# Patient Record
Sex: Female | Born: 1980 | Race: White | Hispanic: No | Marital: Married | Smoking: Former smoker
Health system: Southern US, Community
[De-identification: ages and names within clinical notes are randomized; demographics above are authoritative.]

## PROBLEM LIST (undated history)

## (undated) HISTORY — PX: WRIST SURGERY: SHX841

## (undated) HISTORY — PX: TONSILLECTOMY: SUR1361

---

## 2017-04-20 ENCOUNTER — Ambulatory Visit: Payer: Self-pay | Admitting: Osteopathic Medicine

## 2017-04-23 ENCOUNTER — Ambulatory Visit: Payer: Self-pay | Admitting: Osteopathic Medicine

## 2019-05-12 ENCOUNTER — Ambulatory Visit: Payer: Self-pay | Attending: Internal Medicine

## 2019-05-12 DIAGNOSIS — Z23 Encounter for immunization: Secondary | ICD-10-CM | POA: Insufficient documentation

## 2019-05-12 NOTE — Progress Notes (Signed)
   Covid-19 Vaccination Clinic  Name:  Felicia Campbell    MRN: [id number] DOB: 10/03/1980  05/12/2019  [name] was observed post Covid-19 immunization for 15 minutes without incident. She was provided with Vaccine Information Sheet and instruction to access the V-Safe system.   [name] was instructed to call 911 with any severe reactions post vaccine: Marland Kitchen Difficulty breathing  . Swelling of face and throat  . A fast heartbeat  . A bad rash all over body  . Dizziness and weakness

## 2019-06-14 ENCOUNTER — Ambulatory Visit: Payer: Self-pay

## 2019-06-14 ENCOUNTER — Ambulatory Visit: Payer: Self-pay | Attending: Internal Medicine

## 2019-06-14 DIAGNOSIS — Z23 Encounter for immunization: Secondary | ICD-10-CM

## 2019-06-14 NOTE — Progress Notes (Signed)
   Covid-19 Vaccination Clinic  Name:  [name]    MRN: [id number] DOB: [birth year]  06/14/2019  [name] was observed post Covid-19 immunization for 15 minutes without incident. She was provided with Vaccine Information Sheet and instruction to access the V-Safe system.   [name] was instructed to call 911 with any severe reactions post vaccine: Marland Kitchen Difficulty breathing  . Swelling of face and throat  . A fast heartbeat  . A bad rash all over body  . Dizziness and weakness   Immunizations Administered    Name Date Dose VIS Date Route   Pfizer COVID-19 Vaccine 06/14/2019  2:58 PM 0.3 mL 02/17/2019 Intramuscular   Manufacturer: [employer], [employer]   Lot: [id number]   NDC: [address]-7

## 2019-08-29 ENCOUNTER — Emergency Department (INDEPENDENT_AMBULATORY_CARE_PROVIDER_SITE_OTHER)
Admission: EM | Admit: 2019-08-29 | Discharge: 2019-08-29 | Disposition: A | Discharge status: Home / Self Care | Payer: BC Managed Care – PPO | Source: Home / Self Care | Attending: Family Medicine | Admitting: Family Medicine

## 2019-08-29 ENCOUNTER — Emergency Department (INDEPENDENT_AMBULATORY_CARE_PROVIDER_SITE_OTHER): Payer: BC Managed Care – PPO

## 2019-08-29 ENCOUNTER — Encounter: Payer: Self-pay | Admitting: Emergency Medicine

## 2019-08-29 ENCOUNTER — Other Ambulatory Visit: Payer: Self-pay

## 2019-08-29 DIAGNOSIS — R1904 Left lower quadrant abdominal swelling, mass and lump: Secondary | ICD-10-CM

## 2019-08-29 DIAGNOSIS — R1032 Left lower quadrant pain: Secondary | ICD-10-CM

## 2019-08-29 LAB — POCT URINALYSIS DIP (MANUAL ENTRY)
Bilirubin, UA: NEGATIVE
Blood, UA: NEGATIVE
Glucose, UA: NEGATIVE mg/dL
Ketones, POC UA: NEGATIVE mg/dL
Leukocytes, UA: NEGATIVE
Nitrite, UA: NEGATIVE
Protein Ur, POC: NEGATIVE mg/dL
Spec Grav, UA: 1.02 (ref 1.010–1.025)
Urobilinogen, UA: 0.2 E.U./dL
pH, UA: 5.5 (ref 5.0–8.0)

## 2019-08-29 MED ORDER — IBUPROFEN 800 MG PO TABS
800.0000 mg | ORAL_TABLET | Freq: Once | ORAL | Status: AC
  Administered 2019-08-29: 800 mg via ORAL

## 2019-08-29 NOTE — ED Provider Notes (Signed)
[facility]    CSN: [id number] Arrival date & time: 08/29/19  1159      History   Chief Complaint Chief Complaint  Patient presents with  . Abdominal Pain    HPI Felicia Campbell is a 39 y.o. female.   At about noon yesterday patient developed a vague burning pressure-like pain in her left lower abdomen at the site of her C-section scar.  The pain has intensified today, worse with any movement.  She denies pelvic pain, urinary symptoms, vaginal discharge, nausea/vomiting, and fevers, chills, and sweats.  She denies recent injury, and no recent increase in physical activity. No LMP recorded. (Menstrual status: IUD).        The history is provided by the patient.  Abdominal Pain Pain location:  LLQ Pain quality: burning   Pain radiates to:  Does not radiate Pain severity:  Mild Onset quality:  Sudden Duration:  1 day Timing:  Constant Progression:  Worsening Chronicity:  New Context: previous surgery   Context: not awakening from sleep, not diet changes, not eating, not recent illness and not trauma   Relieved by:  Nothing Worsened by:  Movement, palpation and position changes Ineffective treatments:  None tried Associated symptoms: no anorexia, no belching, no chest pain, no chills, no constipation, no cough, no diarrhea, no dysuria, no fatigue, no fever, no flatus, no hematemesis, no hematochezia, no hematuria, no melena, no nausea, no vaginal bleeding, no vaginal discharge and no vomiting   Risk factors: multiple surgeries and obesity     History reviewed. No pertinent past medical history.  There are no problems to display for this patient.   Past Surgical History:  Procedure Laterality Date  . CESAREAN SECTION     twice  . TONSILLECTOMY    . WRIST SURGERY      OB History    [id number]            Home Medications    Prior to Admission medications   Not on File    Family History No family history on file.  Social History Social  History   Tobacco Use  . Smoking status: Former Games developer  . Smokeless tobacco: Never Used  Substance Use Topics  . Alcohol use: Yes  . Drug use: Not on file     Allergies   Tetracyclines & related   Review of Systems Review of Systems  Constitutional: Negative for chills, diaphoresis, fatigue and fever.  Respiratory: Negative for cough.   Cardiovascular: Negative for chest pain.  Gastrointestinal: Positive for abdominal pain. Negative for abdominal distention, anorexia, blood in stool, constipation, diarrhea, flatus, hematemesis, hematochezia, melena, nausea and vomiting.  Genitourinary: Negative for dysuria, flank pain, frequency, hematuria, pelvic pain, urgency, vaginal bleeding and vaginal discharge.  Skin: Negative for rash.  All other systems reviewed and are negative.    Physical Exam Triage Vital Signs ED Triage Vitals  Enc Vitals Group     BP 08/29/19 1227 117/84     Pulse Rate 08/29/19 1227 65     Resp 08/29/19 1227 16     Temp 08/29/19 1227 97.9 F (36.6 C)     Temp Source 08/29/19 1227 Oral     SpO2 08/29/19 1227 97 %     Weight 08/29/19 1228 235 lb (106.6 kg)     Height 08/29/19 1228 5\' 9"  (1.753 m)     Head Circumference --      Peak Flow --      Pain Score 08/29/19 1228 6  Pain Loc --      Pain Edu? --      Excl. in GC? --    No data found.  Updated Vital Signs BP 117/84 (BP Location: Right Arm)   Pulse 65   Temp 97.9 F (36.6 C) (Oral)   Resp 16   Ht 5\' 9"  (1.753 m)   Wt 106.6 kg   SpO2 97%   BMI 34.70 kg/m   Visual Acuity Right Eye Distance:   Left Eye Distance:   Bilateral Distance:    Right Eye Near:   Left Eye Near:    Bilateral Near:     Physical Exam Vitals and nursing note reviewed.  Constitutional:      General: She is not in acute distress. HENT:     Head: Normocephalic.     Nose: Nose normal.     Mouth/Throat:     Pharynx: Oropharynx is clear.  Eyes:     Conjunctiva/sclera: Conjunctivae normal.     Pupils:  Pupils are equal, round, and reactive to light.  Cardiovascular:     Rate and Rhythm: Normal rate.     Heart sounds: Normal heart sounds.  Pulmonary:     Effort: Pulmonary effort is normal.     Breath sounds: Normal breath sounds.  Abdominal:     Palpations: Abdomen is soft.     Tenderness: There is abdominal tenderness.       Comments: There is distinct tenderness to palpation at left aspect of C-section surgical scar. No definite hernia palpated during Valsalva  Musculoskeletal:     Right lower leg: No edema.     Left lower leg: No edema.  Lymphadenopathy:     Cervical: No cervical adenopathy.  Skin:    General: Skin is warm and dry.     Findings: No rash.  Neurological:     Mental Status: She is alert and oriented to person, place, and time.      UC Treatments / Results  Labs (all labs ordered are listed, but only abnormal results are displayed) Labs Reviewed  POCT URINALYSIS DIP (MANUAL ENTRY) - Abnormal; Notable for the following components:      Result Value   Color, UA light yellow (*)    All other components within normal limits    EKG   Radiology PELVIS LIMITED (TRANSABDOMINAL ONLY)  Result Date: 08/29/2019 CLINICAL DATA:  Left lower quadrant pain. EXAM: LIMITED ULTRASOUND OF PELVIS TECHNIQUE: Limited transabdominal ultrasound examination of the pelvis was performed. COMPARISON:  Abdomen series 08/29/2019. FINDINGS: Ill-defined 2.2 x 1.8 x 2.2 cm heterogeneous subcutaneous lesion. Vascular flow noted within the lesion by Doppler exam. The lesion may be connected to the skin surface. This is in the site of prior Caesarean section could represent scarring. Infection or malignancy cannot be excluded. No cystic abnormalities identified. IMPRESSION: Ill-defined 2.2 x 1.8 x 2.2 cm heterogeneous subcutaneous lesion in the region the patient's series scar as above. Electronically Signed   By: 08/31/2019  Register   On: 08/29/2019 15:22   DG Abd 2 Views  Result Date:  08/29/2019 CLINICAL DATA:  LEFT lower quadrant pain since yesterday EXAM: ABDOMEN - 2 VIEW COMPARISON:  None FINDINGS: Lung bases clear. Scattered gas and stool throughout colon to rectum. No bowel dilatation, bowel wall thickening, or free air. IUD projects over pelvis. Small calcification projects over sacrum, question phlebolith versus uterine. No definite urinary tract calcifications. Osseous structures unremarkable. IMPRESSION: No acute abnormalities. Electronically Signed   By: 08/31/2019  M.D.   On: 08/29/2019 13:41    Procedures Procedures (including critical care time)  Medications Ordered in UC Medications  ibuprofen (ADVIL) tablet 800 mg (800 mg Oral Given 08/29/19 1614)    Initial Impression / Assessment and Plan / UC Course  I have reviewed the triage vital signs and the nursing notes.  Pertinent labs & imaging results that were available during my care of the patient were reviewed by me and considered in my medical decision making (see chart for details).    Will refer to surgeon Dr. Tally Due for evaluation and management.   Final Clinical Impressions(s) / UC Diagnoses   Final diagnoses:  Left lower quadrant abdominal pain  Abdominal wall mass of left lower quadrant     Discharge Instructions     May take Ibuprofen 200mg , 4 tabs every 8 hours with food as needed for pain.    ED Prescriptions    None        Kandra Nicolas, MD 08/29/19 1642

## 2019-08-29 NOTE — Discharge Instructions (Addendum)
May take Ibuprofen 200mg, 4 tabs every 8 hours with food as needed for pain. 

## 2019-08-29 NOTE — ED Triage Notes (Signed)
Patient having lower left quadrant pain that is constant with occasional burning since yesterday; was able to sleep last night; denies nausea, vomiting, diarrhea, dysuria. No OTC today. Has had covid vaccinations.

## 2021-11-17 IMAGING — DX DG ABDOMEN 2V
3 series · 3 of 3 positions shown · non-contrast
Comparison: None

CLINICAL DATA: LEFT lower quadrant pain since yesterday

EXAM:
ABDOMEN - 2 VIEW

[abdomen erect]
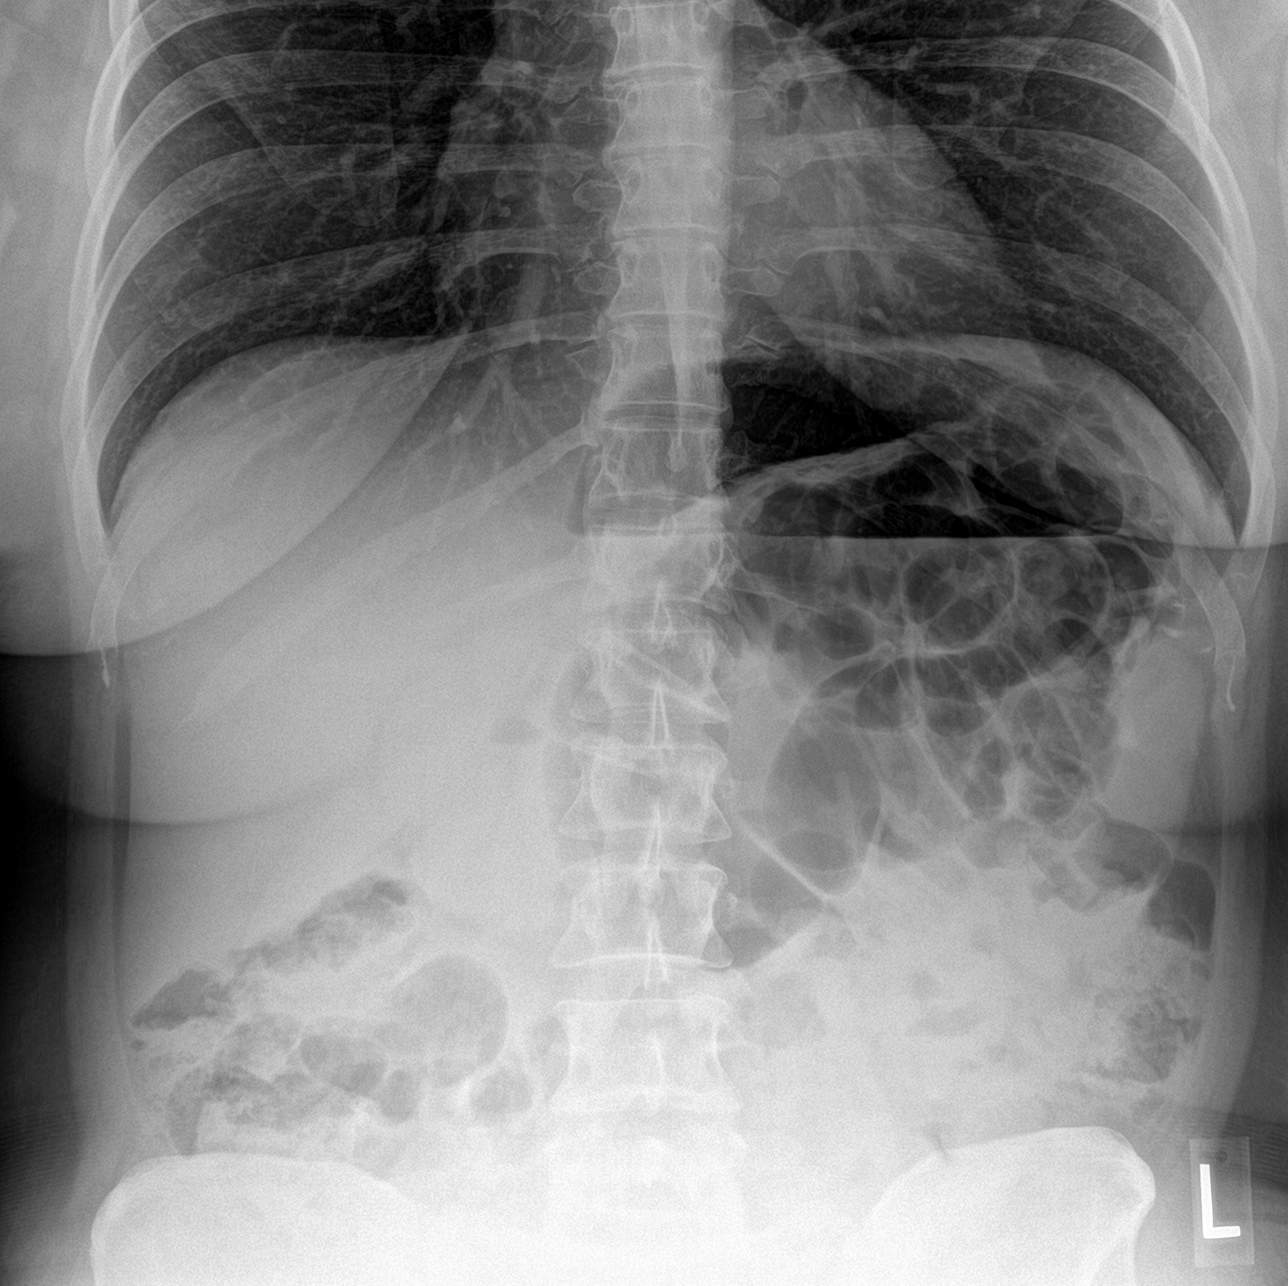

[abdomen supine (1 of 2)]
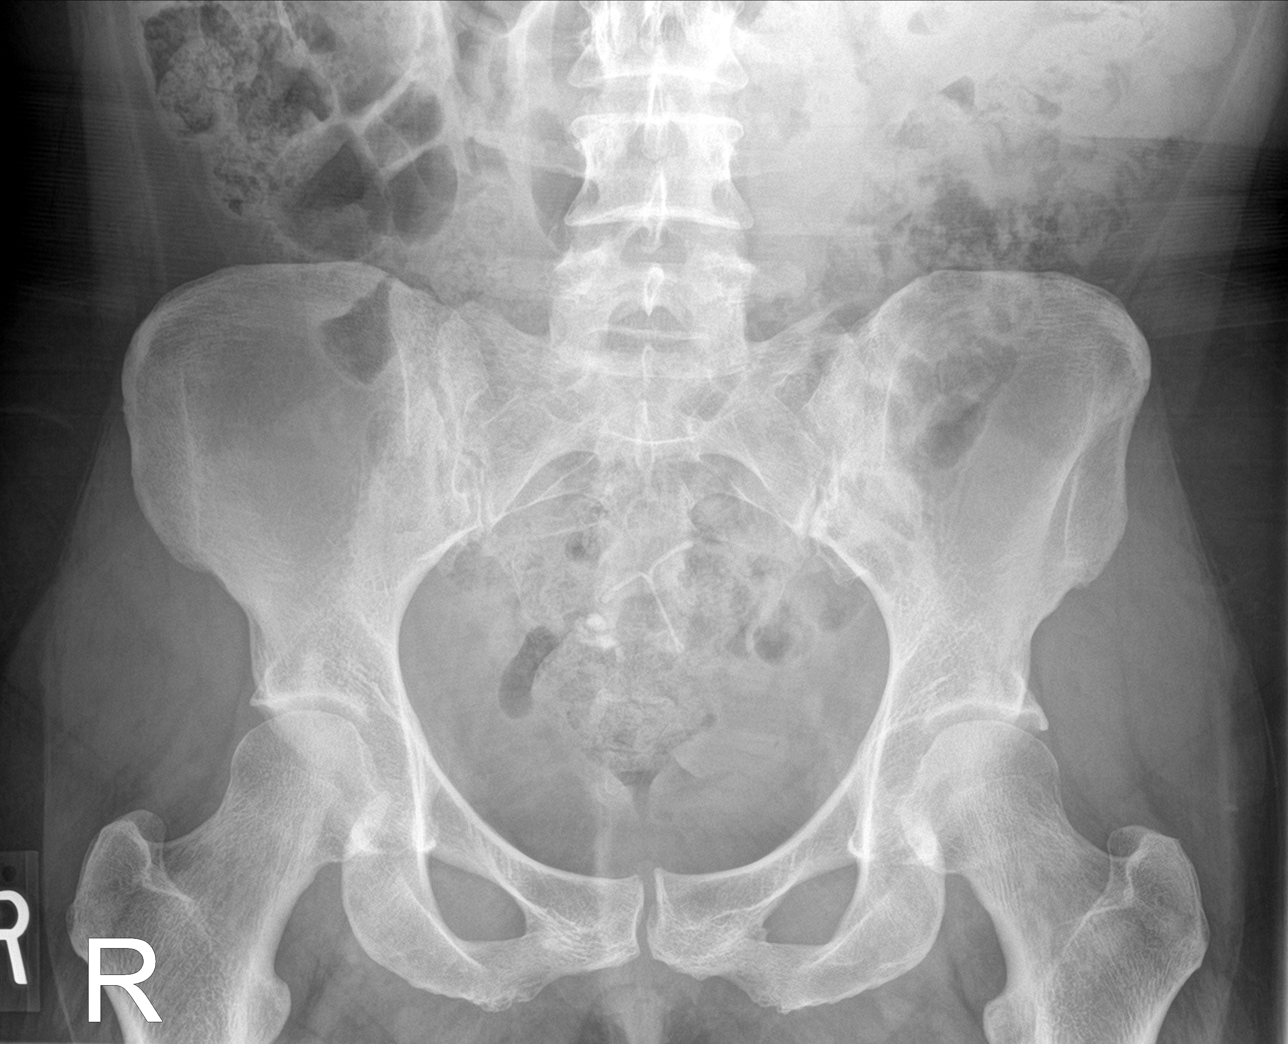

[abdomen supine (2 of 2)]
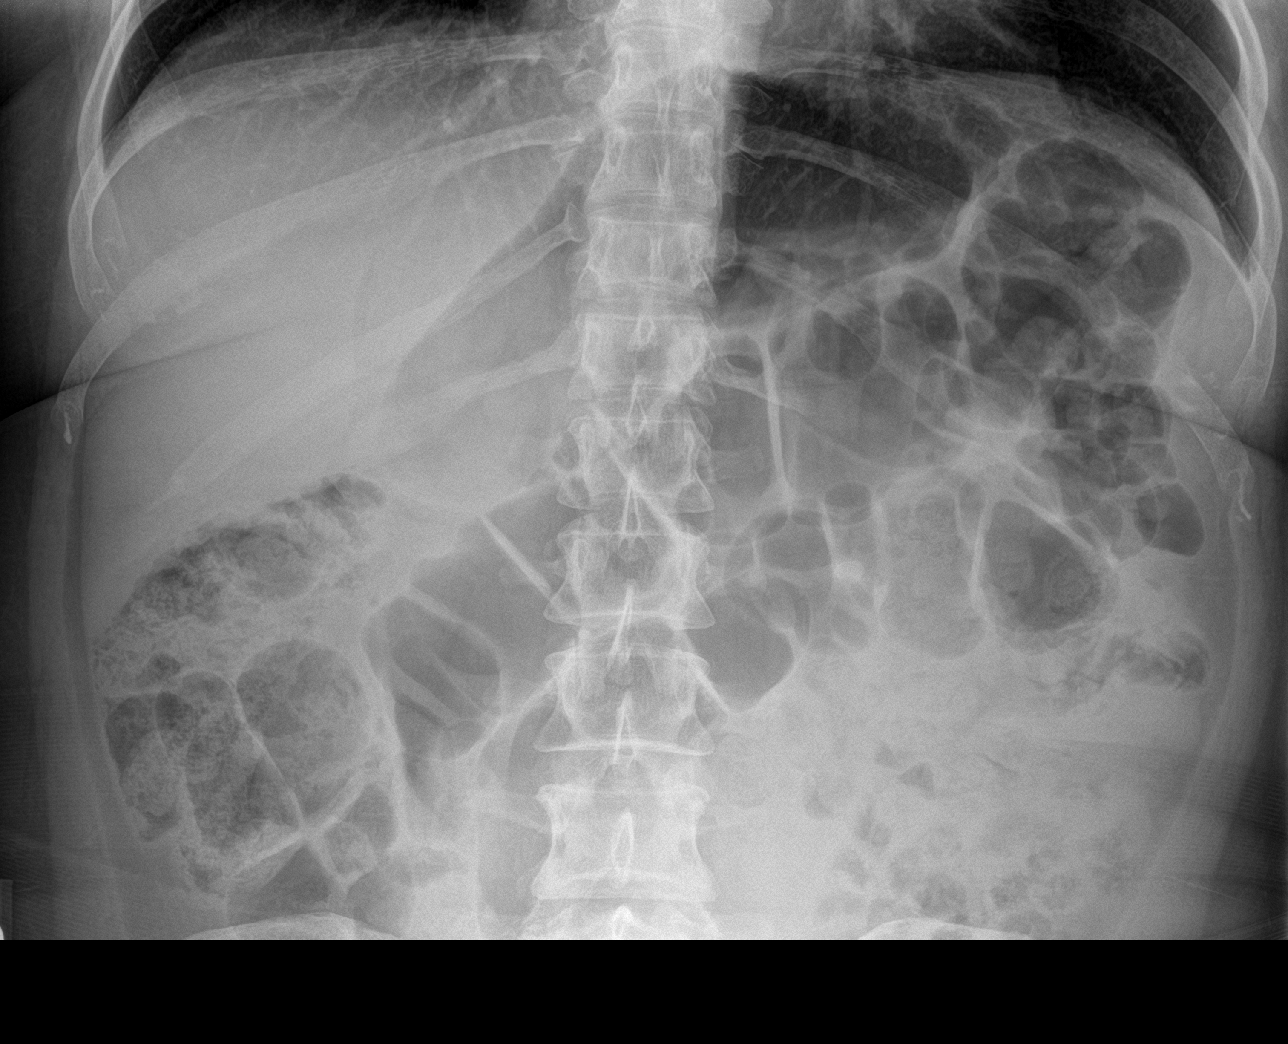

[3 of 3 positions shown; findings below may reference images not displayed]

FINDINGS: Lung bases clear.

Scattered gas and stool throughout colon to rectum.

No bowel dilatation, bowel wall thickening, or free air.

IUD projects over pelvis.

Small calcification projects over sacrum, question phlebolith versus
uterine.

No definite urinary tract calcifications.

Osseous structures unremarkable.
IMPRESSION: No acute abnormalities.

## 2021-11-17 IMAGING — US US PELVIS LIMITED
1 series · 14 of 25 positions shown · non-contrast
Comparison: Abdomen series 08/29/2019.

CLINICAL DATA: Left lower quadrant pain.

EXAM:
LIMITED ULTRASOUND OF PELVIS
TECHNIQUE: Limited transabdominal ultrasound examination of the pelvis was
performed.

[Series 1: us pelvis limited · 30 acquisitions, 14 frames shown]
[im 1/30]
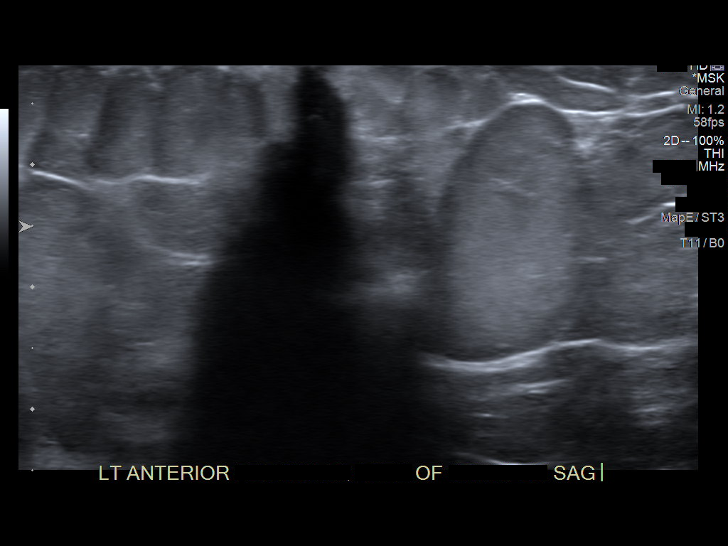
[im 3/30]
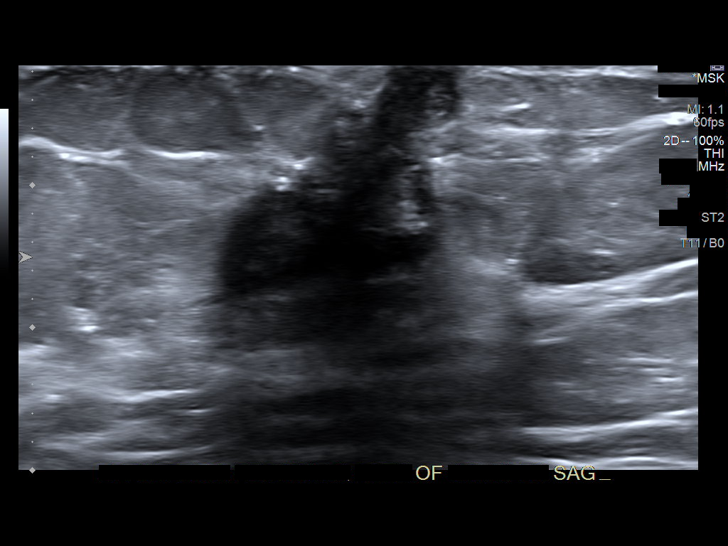
[im 5/30]
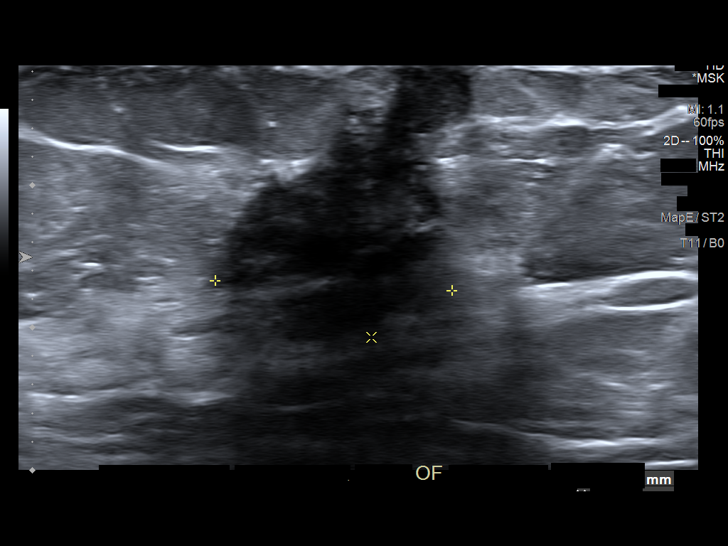
[im 8/30]
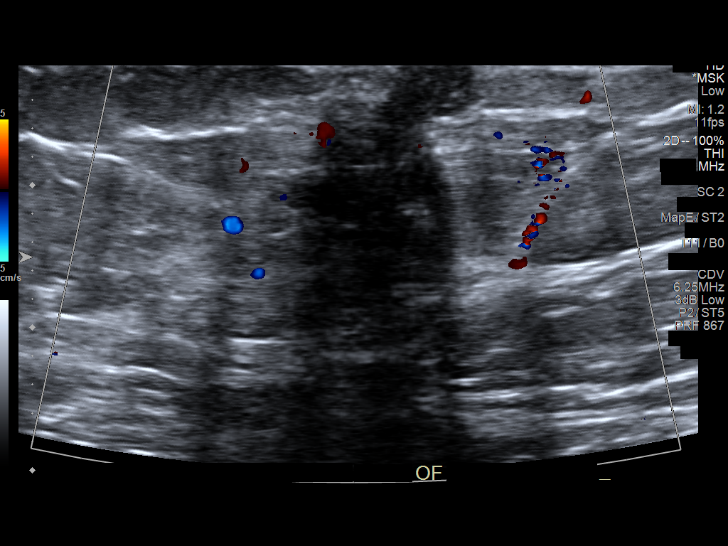
[im 10/30]
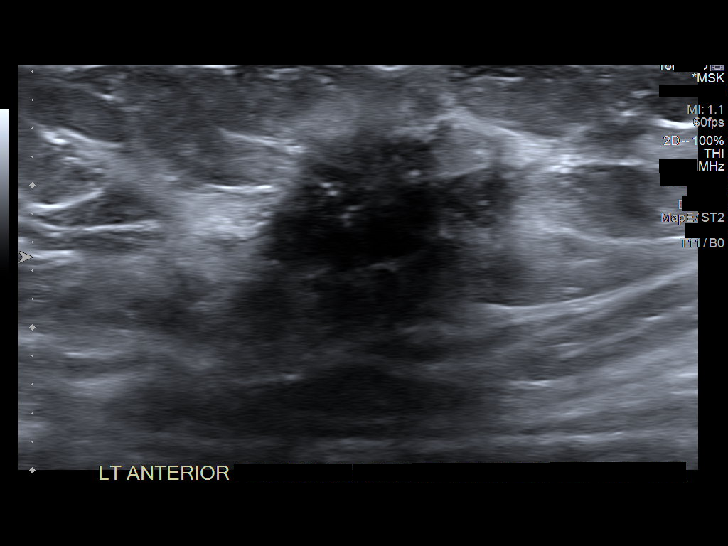
[im 11/30]
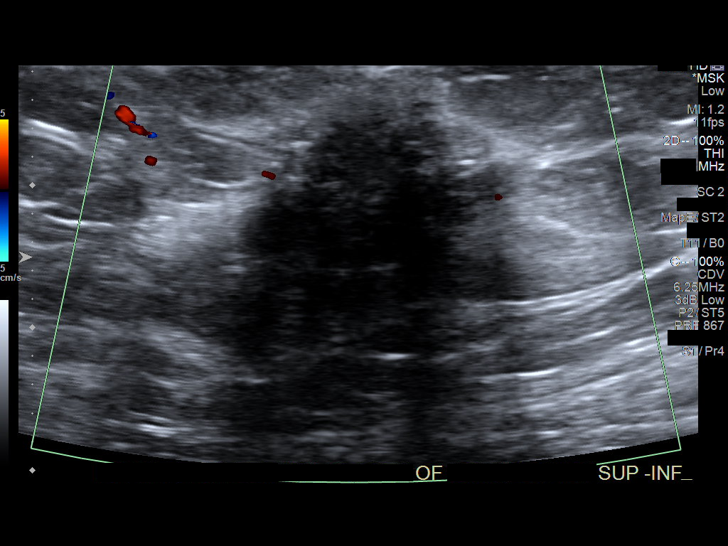
[im 14/30]
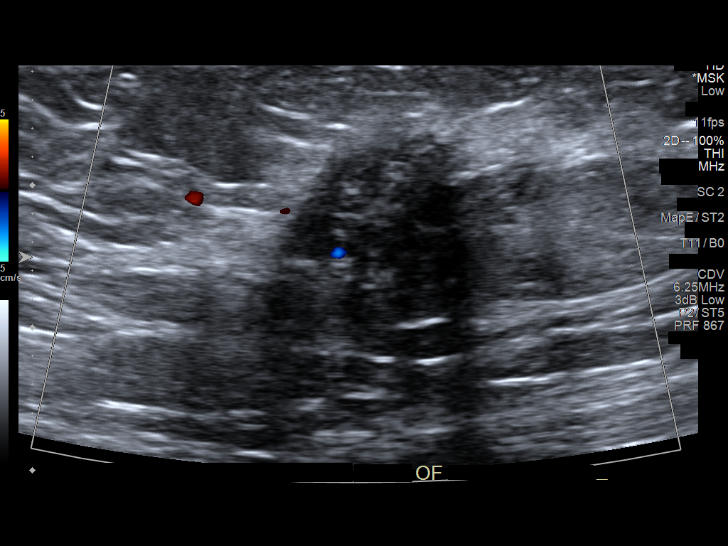
[im 16/30]
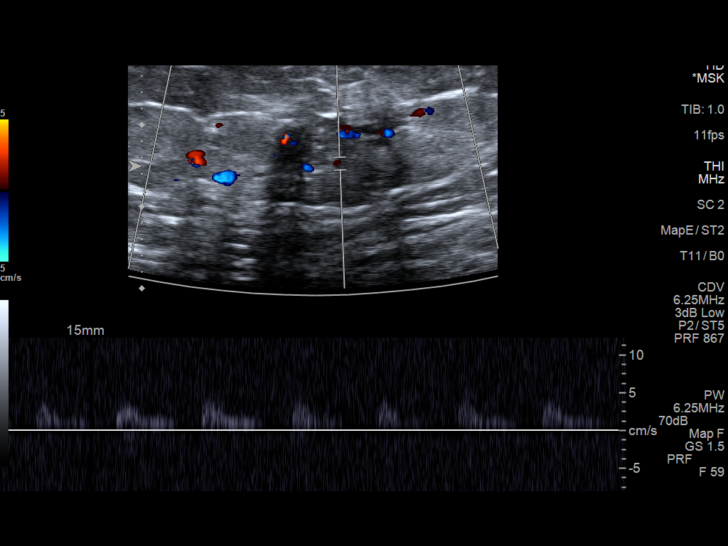
[im 19/30]
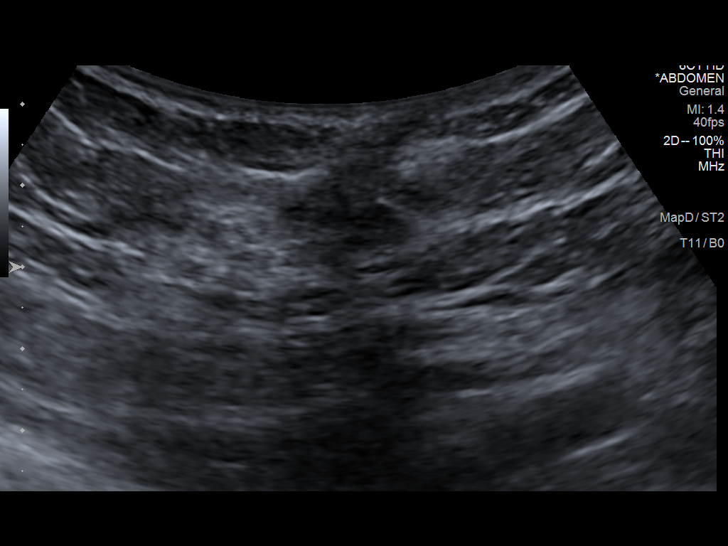
[im 20/30]
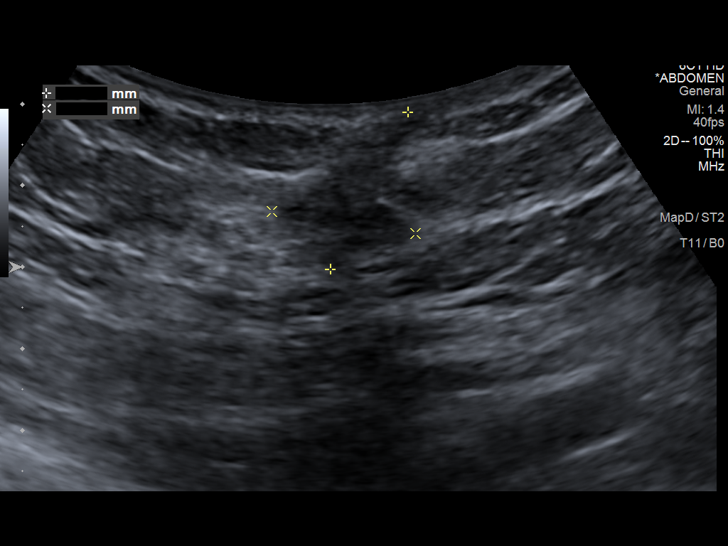
[im 22/30]
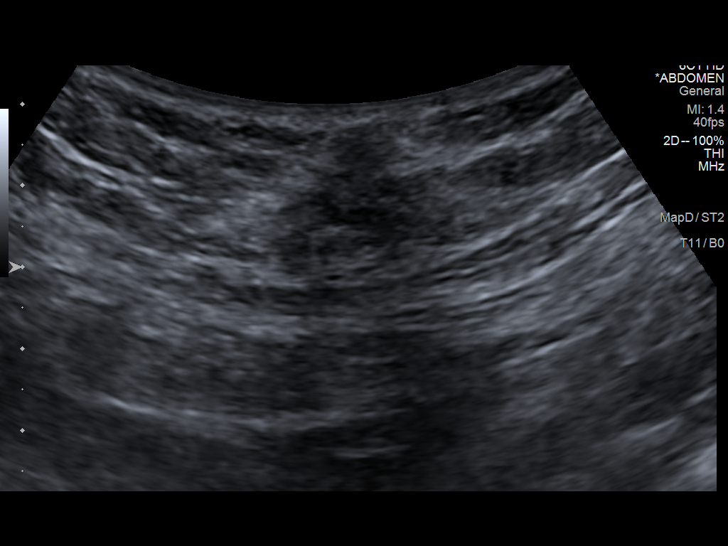
[im 25/30]
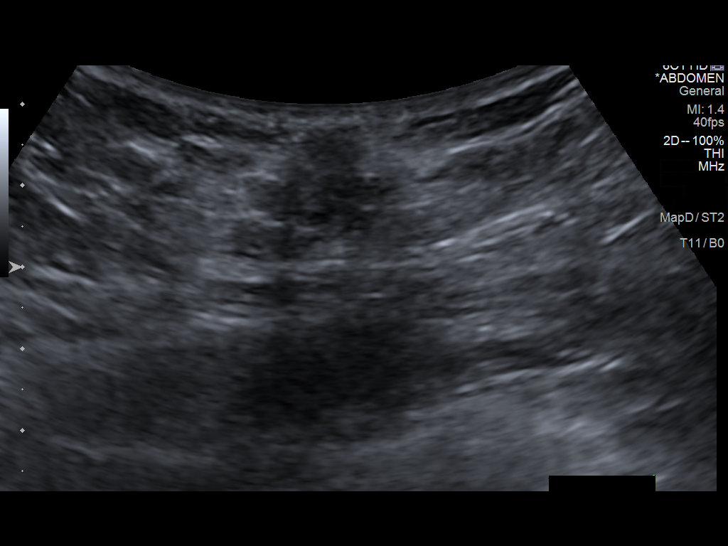
[im 27/30]
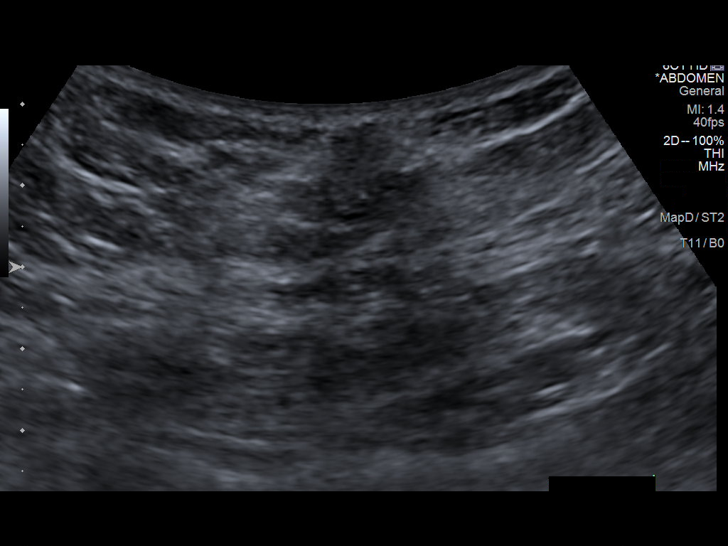
[im 30/30]
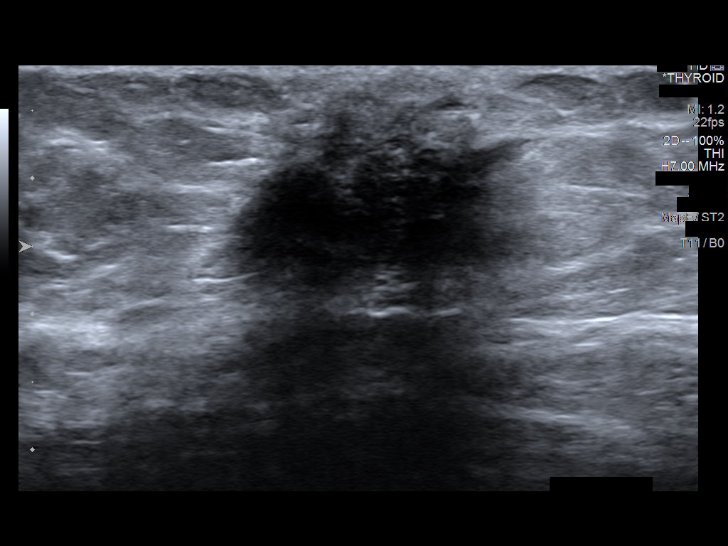

[14 of 25 positions shown; findings below may reference images not displayed]

FINDINGS: Ill-defined 2.2 x 1.8 x 2.2 cm heterogeneous subcutaneous lesion.
Vascular flow noted within the lesion by Doppler exam. The lesion
may be connected to the skin surface. This is in the site of prior
Caesarean section could represent scarring. Infection or malignancy
cannot be excluded. No cystic abnormalities identified.
IMPRESSION: Ill-defined 2.2 x 1.8 x 2.2 cm heterogeneous subcutaneous lesion in
the region the patient's series scar as above.
